# Patient Record
Sex: Female | Born: 1970 | Race: White | Hispanic: No | Marital: Married | Smoking: Former smoker
Health system: Southern US, Community
[De-identification: ages and names within clinical notes are randomized; demographics above are authoritative.]

## PROBLEM LIST (undated history)

## (undated) HISTORY — PX: WISDOM TOOTH EXTRACTION: SHX21

---

## 1998-11-09 ENCOUNTER — Other Ambulatory Visit: Admission: RE | Admit: 1998-11-09 | Discharge: 1998-11-09 | Payer: Self-pay | Admitting: Obstetrics & Gynecology

## 2000-02-08 ENCOUNTER — Inpatient Hospital Stay (HOSPITAL_COMMUNITY): Admission: AD | Admit: 2000-02-08 | Discharge: 2000-02-11 | Payer: Self-pay | Admitting: Obstetrics & Gynecology

## 2002-05-25 ENCOUNTER — Other Ambulatory Visit: Admission: RE | Admit: 2002-05-25 | Discharge: 2002-05-25 | Payer: Self-pay | Admitting: Obstetrics and Gynecology

## 2003-05-28 ENCOUNTER — Other Ambulatory Visit: Admission: RE | Admit: 2003-05-28 | Discharge: 2003-05-28 | Payer: Self-pay | Admitting: Obstetrics and Gynecology

## 2004-07-27 ENCOUNTER — Other Ambulatory Visit: Admission: RE | Admit: 2004-07-27 | Discharge: 2004-07-27 | Payer: Self-pay | Admitting: Obstetrics and Gynecology

## 2005-08-29 ENCOUNTER — Other Ambulatory Visit: Admission: RE | Admit: 2005-08-29 | Discharge: 2005-08-29 | Payer: Self-pay | Admitting: Obstetrics and Gynecology

## 2012-08-27 LAB — OB RESULTS CONSOLE GC/CHLAMYDIA
Chlamydia: NEGATIVE
Gonorrhea: NEGATIVE

## 2012-08-27 LAB — OB RESULTS CONSOLE RPR: RPR: NONREACTIVE

## 2012-08-27 LAB — OB RESULTS CONSOLE HIV ANTIBODY (ROUTINE TESTING): HIV: NONREACTIVE

## 2012-08-27 LAB — OB RESULTS CONSOLE ABO/RH

## 2013-03-20 ENCOUNTER — Encounter (HOSPITAL_COMMUNITY): Payer: Self-pay

## 2013-03-20 ENCOUNTER — Encounter (HOSPITAL_COMMUNITY): Payer: Self-pay | Admitting: Pharmacist

## 2013-03-20 ENCOUNTER — Encounter (HOSPITAL_COMMUNITY)
Admission: RE | Admit: 2013-03-20 | Discharge: 2013-03-20 | Disposition: A | Discharge status: Home / Self Care | Payer: Managed Care, Other (non HMO) | Source: Ambulatory Visit | Attending: Obstetrics & Gynecology | Admitting: Obstetrics & Gynecology

## 2013-03-20 DIAGNOSIS — Z01812 Encounter for preprocedural laboratory examination: Secondary | ICD-10-CM | POA: Insufficient documentation

## 2013-03-20 DIAGNOSIS — Z01818 Encounter for other preprocedural examination: Secondary | ICD-10-CM | POA: Insufficient documentation

## 2013-03-20 LAB — CBC
HCT: 32.4 % — ABNORMAL LOW (ref 36.0–46.0)
Hemoglobin: 11 g/dL — ABNORMAL LOW (ref 12.0–15.0)
MCH: 28.9 pg (ref 26.0–34.0)
MCHC: 34 g/dL (ref 30.0–36.0)
MCV: 85 fL (ref 78.0–100.0)
RBC: 3.81 MIL/uL — ABNORMAL LOW (ref 3.87–5.11)

## 2013-03-20 LAB — ABO/RH: ABO/RH(D): AB POS

## 2013-03-20 LAB — TYPE AND SCREEN: Antibody Screen: NEGATIVE

## 2013-03-20 NOTE — Patient Instructions (Addendum)
Your procedure is scheduled on: 03/23/2013  Enter through the Main Entrance of [facility] at: 0930AM  Pick up the phone at the desk and dial [phone].  Call this number if you have problems the morning of surgery: [phone].  Remember: Do NOT eat food: AFTER MIDNIGHT 03/22/2013 Do NOT drink clear liquids after:  AFTER MIDNIGHT 03/22/2013  Take these medicines the morning of surgery with a SIP OF WATER: NONE  Do NOT wear jewelry (body piercing), make-up, or nail polish. Do NOT wear lotions, powders, or perfumes.  You may wear deoderant. Do NOT shave for 48 hours prior to surgery. Do NOT bring valuables to the hospital. Contacts, dentures, or bridgework may not be worn into surgery. Leave suitcase in car.  After surgery it may be brought to your room.  For patients admitted to the hospital, checkout time is 11:00 AM the day of discharge. Have a responsible adult drive you home and stay with you for 24 hours after your procedure

## 2013-03-20 NOTE — H&P (Signed)
42 y.o. [id number]  Estimated Date of Delivery: 03/24/13 admitted at 39/[redacted] weeks gestation for repeat C/S.  Prenatal Transfer Tool  Maternal Diabetes: No Genetic Screening: Normal Maternal Ultrasounds/Referrals: Normal Fetal Ultrasounds or other Referrals:  None Maternal Substance Abuse:  No Significant Maternal Medications:  None Significant Maternal Lab Results: None Other Significant Pregnancy Complications:  Previous C/S.    Afebrile, VSS Heart and Lungs: No active disease Abdomen: soft, gravid, EFW AGA. Cervical exam:  1/20, vertex floating  Impression: 40 week pregnancy, previous C/S, Unfavorable cervix  Plan:  Repeat C/S

## 2013-03-23 ENCOUNTER — Encounter (HOSPITAL_COMMUNITY)
Admission: AD | Disposition: A | Discharge status: Home / Self Care | Payer: Self-pay | Source: Ambulatory Visit | Attending: Obstetrics & Gynecology

## 2013-03-23 ENCOUNTER — Encounter (HOSPITAL_COMMUNITY): Payer: Self-pay | Admitting: *Deleted

## 2013-03-23 ENCOUNTER — Inpatient Hospital Stay (HOSPITAL_COMMUNITY): Payer: Managed Care, Other (non HMO) | Admitting: Anesthesiology

## 2013-03-23 ENCOUNTER — Encounter (HOSPITAL_COMMUNITY): Payer: Managed Care, Other (non HMO) | Admitting: Anesthesiology

## 2013-03-23 ENCOUNTER — Inpatient Hospital Stay (HOSPITAL_COMMUNITY)
Admission: AD | Admit: 2013-03-23 | Discharge: 2013-03-25 | DRG: 766 | Disposition: A | Discharge status: Home / Self Care | Payer: Managed Care, Other (non HMO) | Source: Ambulatory Visit | Attending: Obstetrics & Gynecology | Admitting: Obstetrics & Gynecology

## 2013-03-23 DIAGNOSIS — O09529 Supervision of elderly multigravida, unspecified trimester: Secondary | ICD-10-CM | POA: Diagnosis present

## 2013-03-23 DIAGNOSIS — O34219 Maternal care for unspecified type scar from previous cesarean delivery: Principal | ICD-10-CM | POA: Diagnosis present

## 2013-03-23 SURGERY — Surgical Case
Anesthesia: Spinal

## 2013-03-23 MED ORDER — DIPHENHYDRAMINE HCL 25 MG PO CAPS: 25.0000 mg | ORAL_CAPSULE | Freq: Four times a day (QID) | ORAL | Status: DC | PRN

## 2013-03-23 MED ORDER — WITCH HAZEL-GLYCERIN EX PADS: 1.0000 "application " | MEDICATED_PAD | CUTANEOUS | Status: DC | PRN

## 2013-03-23 MED ORDER — LACTATED RINGERS IV SOLN
Freq: Once | INTRAVENOUS | Status: AC
  Administered 2013-03-23: 10:00:00 via INTRAVENOUS

## 2013-03-23 MED ORDER — METOCLOPRAMIDE HCL 5 MG/ML IJ SOLN: 10.0000 mg | Freq: Three times a day (TID) | INTRAMUSCULAR | Status: DC | PRN

## 2013-03-23 MED ORDER — ONDANSETRON HCL 4 MG/2ML IJ SOLN
INTRAMUSCULAR | Status: DC | PRN
  Administered 2013-03-23: 4 mg via INTRAVENOUS

## 2013-03-23 MED ORDER — MORPHINE SULFATE 0.5 MG/ML IJ SOLN
INTRAMUSCULAR | Status: AC
  Filled 2013-03-23: qty 10

## 2013-03-23 MED ORDER — TETANUS-DIPHTH-ACELL PERTUSSIS 5-2.5-18.5 LF-MCG/0.5 IM SUSP
0.5000 mL | Freq: Once | INTRAMUSCULAR | Status: AC
  Administered 2013-03-24: 0.5 mL via INTRAMUSCULAR
  Filled 2013-03-23: qty 0.5

## 2013-03-23 MED ORDER — PHENYLEPHRINE HCL 10 MG/ML IJ SOLN
INTRAMUSCULAR | Status: AC
  Filled 2013-03-23: qty 1

## 2013-03-23 MED ORDER — DIPHENHYDRAMINE HCL 25 MG PO CAPS
25.0000 mg | ORAL_CAPSULE | ORAL | Status: DC | PRN
  Filled 2013-03-23: qty 1

## 2013-03-23 MED ORDER — SIMETHICONE 80 MG PO CHEW
80.0000 mg | CHEWABLE_TABLET | Freq: Three times a day (TID) | ORAL | Status: DC
  Administered 2013-03-24 – 2013-03-25 (×4): 80 mg via ORAL
  Filled 2013-03-23 (×4): qty 1

## 2013-03-23 MED ORDER — SCOPOLAMINE 1 MG/3DAYS TD PT72
MEDICATED_PATCH | TRANSDERMAL | Status: AC
  Administered 2013-03-23: 1.5 mg via TRANSDERMAL
  Filled 2013-03-23: qty 1

## 2013-03-23 MED ORDER — MEPERIDINE HCL 25 MG/ML IJ SOLN: 6.2500 mg | INTRAMUSCULAR | Status: DC | PRN

## 2013-03-23 MED ORDER — MENTHOL 3 MG MT LOZG: 1.0000 | LOZENGE | OROMUCOSAL | Status: DC | PRN

## 2013-03-23 MED ORDER — CEFAZOLIN SODIUM-DEXTROSE 2-3 GM-% IV SOLR
INTRAVENOUS | Status: AC
  Administered 2013-03-23: 2 g via INTRAVENOUS
  Filled 2013-03-23: qty 50

## 2013-03-23 MED ORDER — KETOROLAC TROMETHAMINE 30 MG/ML IJ SOLN: 30.0000 mg | Freq: Four times a day (QID) | INTRAMUSCULAR | Status: AC | PRN

## 2013-03-23 MED ORDER — PHENYLEPHRINE 8 MG IN D5W 100 ML (0.08MG/ML) PREMIX OPTIME
INJECTION | INTRAVENOUS | Status: DC | PRN
  Administered 2013-03-23: 40 ug/min via INTRAVENOUS

## 2013-03-23 MED ORDER — ONDANSETRON HCL 4 MG/2ML IJ SOLN: 4.0000 mg | Freq: Three times a day (TID) | INTRAMUSCULAR | Status: DC | PRN

## 2013-03-23 MED ORDER — ONDANSETRON HCL 4 MG/2ML IJ SOLN
INTRAMUSCULAR | Status: AC
  Filled 2013-03-23: qty 2

## 2013-03-23 MED ORDER — ONDANSETRON HCL 4 MG/2ML IJ SOLN: 4.0000 mg | INTRAMUSCULAR | Status: DC | PRN

## 2013-03-23 MED ORDER — KETOROLAC TROMETHAMINE 30 MG/ML IJ SOLN
INTRAMUSCULAR | Status: AC
  Filled 2013-03-23: qty 1

## 2013-03-23 MED ORDER — OXYTOCIN 40 UNITS IN LACTATED RINGERS INFUSION - SIMPLE MED
INTRAVENOUS | Status: DC | PRN
  Administered 2013-03-23: 40 [IU] via INTRAVENOUS

## 2013-03-23 MED ORDER — FENTANYL CITRATE 0.05 MG/ML IJ SOLN: 25.0000 ug | INTRAMUSCULAR | Status: DC | PRN

## 2013-03-23 MED ORDER — DIPHENHYDRAMINE HCL 50 MG/ML IJ SOLN: 12.5000 mg | INTRAMUSCULAR | Status: DC | PRN

## 2013-03-23 MED ORDER — DIBUCAINE 1 % RE OINT: 1.0000 "application " | TOPICAL_OINTMENT | RECTAL | Status: DC | PRN

## 2013-03-23 MED ORDER — OXYTOCIN 10 UNIT/ML IJ SOLN
INTRAMUSCULAR | Status: AC
  Filled 2013-03-23: qty 4

## 2013-03-23 MED ORDER — DIPHENHYDRAMINE HCL 50 MG/ML IJ SOLN: 25.0000 mg | INTRAMUSCULAR | Status: DC | PRN

## 2013-03-23 MED ORDER — OXYCODONE-ACETAMINOPHEN 5-325 MG PO TABS: 1.0000 | ORAL_TABLET | ORAL | Status: DC | PRN

## 2013-03-23 MED ORDER — LANOLIN HYDROUS EX OINT: 1.0000 "application " | TOPICAL_OINTMENT | CUTANEOUS | Status: DC | PRN

## 2013-03-23 MED ORDER — NALBUPHINE HCL 10 MG/ML IJ SOLN
5.0000 mg | INTRAMUSCULAR | Status: DC | PRN
  Filled 2013-03-23: qty 1

## 2013-03-23 MED ORDER — SIMETHICONE 80 MG PO CHEW
80.0000 mg | CHEWABLE_TABLET | ORAL | Status: DC
  Administered 2013-03-23 – 2013-03-24 (×2): 80 mg via ORAL
  Filled 2013-03-23 (×2): qty 1

## 2013-03-23 MED ORDER — FENTANYL CITRATE 0.05 MG/ML IJ SOLN
INTRAMUSCULAR | Status: AC
  Filled 2013-03-23: qty 2

## 2013-03-23 MED ORDER — SENNOSIDES-DOCUSATE SODIUM 8.6-50 MG PO TABS
2.0000 | ORAL_TABLET | ORAL | Status: DC
  Administered 2013-03-23 – 2013-03-24 (×2): 2 via ORAL
  Filled 2013-03-23 (×2): qty 2

## 2013-03-23 MED ORDER — ZOLPIDEM TARTRATE 5 MG PO TABS: 5.0000 mg | ORAL_TABLET | Freq: Every evening | ORAL | Status: DC | PRN

## 2013-03-23 MED ORDER — SIMETHICONE 80 MG PO CHEW: 80.0000 mg | CHEWABLE_TABLET | ORAL | Status: DC | PRN

## 2013-03-23 MED ORDER — PRENATAL MULTIVITAMIN CH
1.0000 | ORAL_TABLET | Freq: Every day | ORAL | Status: DC
  Administered 2013-03-24 – 2013-03-25 (×2): 1 via ORAL
  Filled 2013-03-23 (×2): qty 1

## 2013-03-23 MED ORDER — SODIUM CHLORIDE 0.9 % IJ SOLN: 3.0000 mL | INTRAMUSCULAR | Status: DC | PRN

## 2013-03-23 MED ORDER — BUPIVACAINE IN DEXTROSE 0.75-8.25 % IT SOLN
INTRATHECAL | Status: DC | PRN
  Administered 2013-03-23: 1.4 mL via INTRATHECAL

## 2013-03-23 MED ORDER — NALOXONE HCL 0.4 MG/ML IJ SOLN: 0.4000 mg | INTRAMUSCULAR | Status: DC | PRN

## 2013-03-23 MED ORDER — ONDANSETRON HCL 4 MG PO TABS: 4.0000 mg | ORAL_TABLET | ORAL | Status: DC | PRN

## 2013-03-23 MED ORDER — IBUPROFEN 600 MG PO TABS
600.0000 mg | ORAL_TABLET | Freq: Four times a day (QID) | ORAL | Status: DC
  Administered 2013-03-23 – 2013-03-25 (×8): 600 mg via ORAL
  Filled 2013-03-23 (×8): qty 1

## 2013-03-23 MED ORDER — OXYTOCIN 40 UNITS IN LACTATED RINGERS INFUSION - SIMPLE MED: 62.5000 mL/h | INTRAVENOUS | Status: AC

## 2013-03-23 MED ORDER — SCOPOLAMINE 1 MG/3DAYS TD PT72
1.0000 | MEDICATED_PATCH | Freq: Once | TRANSDERMAL | Status: DC
  Administered 2013-03-23: 1.5 mg via TRANSDERMAL

## 2013-03-23 MED ORDER — LACTATED RINGERS IV SOLN
INTRAVENOUS | Status: DC
  Administered 2013-03-23 (×2): via INTRAVENOUS

## 2013-03-23 MED ORDER — PHENYLEPHRINE 40 MCG/ML (10ML) SYRINGE FOR IV PUSH (FOR BLOOD PRESSURE SUPPORT)
PREFILLED_SYRINGE | INTRAVENOUS | Status: AC
  Filled 2013-03-23: qty 5

## 2013-03-23 MED ORDER — NALOXONE HCL 1 MG/ML IJ SOLN
1.0000 ug/kg/h | INTRAVENOUS | Status: DC | PRN
  Filled 2013-03-23: qty 2

## 2013-03-23 MED ORDER — FENTANYL CITRATE 0.05 MG/ML IJ SOLN
INTRAMUSCULAR | Status: DC | PRN
  Administered 2013-03-23: 25 ug via INTRATHECAL

## 2013-03-23 MED ORDER — LACTATED RINGERS IV SOLN
INTRAVENOUS | Status: DC
  Administered 2013-03-23: 21:00:00 via INTRAVENOUS

## 2013-03-23 MED ORDER — CEFAZOLIN SODIUM-DEXTROSE 2-3 GM-% IV SOLR: 2.0000 g | INTRAVENOUS | Status: DC

## 2013-03-23 MED ORDER — LACTATED RINGERS IV SOLN
INTRAVENOUS | Status: DC | PRN
  Administered 2013-03-23: 11:00:00 via INTRAVENOUS

## 2013-03-23 MED ORDER — KETOROLAC TROMETHAMINE 30 MG/ML IJ SOLN
30.0000 mg | Freq: Four times a day (QID) | INTRAMUSCULAR | Status: AC | PRN
  Administered 2013-03-23: 30 mg via INTRAMUSCULAR

## 2013-03-23 MED ORDER — MORPHINE SULFATE (PF) 0.5 MG/ML IJ SOLN
INTRAMUSCULAR | Status: DC | PRN
  Administered 2013-03-23: .15 mg via INTRATHECAL

## 2013-03-23 SURGICAL SUPPLY — 37 items
CLAMP CORD UMBIL (MISCELLANEOUS) IMPLANT
CLOTH BEACON ORANGE TIMEOUT ST (SAFETY) ×2 IMPLANT
CONTAINER PREFILL 10% NBF 15ML (MISCELLANEOUS) IMPLANT
DRAPE LG THREE QUARTER DISP (DRAPES) ×4 IMPLANT
DRSG OPSITE POSTOP 4X10 (GAUZE/BANDAGES/DRESSINGS) ×2 IMPLANT
DURAPREP 26ML APPLICATOR (WOUND CARE) ×2 IMPLANT
ELECT REM PT RETURN 9FT ADLT (ELECTROSURGICAL) ×2
ELECTRODE REM PT RTRN 9FT ADLT (ELECTROSURGICAL) ×1 IMPLANT
EXTRACTOR VACUUM BELL STYLE (SUCTIONS) ×1 IMPLANT
EXTRACTOR VACUUM M CUP 4 TUBE (SUCTIONS) IMPLANT
GLOVE ECLIPSE 6.0 STRL STRAW (GLOVE) ×2 IMPLANT
GLOVE ECLIPSE 6.5 STRL STRAW (GLOVE) ×2 IMPLANT
GOWN PREVENTION PLUS XLARGE (GOWN DISPOSABLE) ×4 IMPLANT
GOWN STRL REIN XL XLG (GOWN DISPOSABLE) ×4 IMPLANT
KIT ABG SYR 3ML LUER SLIP (SYRINGE) IMPLANT
NDL HYPO 25X5/8 SAFETYGLIDE (NEEDLE) ×1 IMPLANT
NEEDLE HYPO 25X5/8 SAFETYGLIDE (NEEDLE) ×2 IMPLANT
NS IRRIG 1000ML POUR BTL (IV SOLUTION) ×2 IMPLANT
PACK C SECTION WH (CUSTOM PROCEDURE TRAY) ×2 IMPLANT
PAD OB MATERNITY 4.3X12.25 (PERSONAL CARE ITEMS) ×2 IMPLANT
RTRCTR C-SECT PINK 25CM LRG (MISCELLANEOUS) ×1 IMPLANT
STAPLER VISISTAT 35W (STAPLE) ×1 IMPLANT
SUT PLAIN 0 NONE (SUTURE) IMPLANT
SUT VIC AB 0 CT1 27 (SUTURE) ×6
SUT VIC AB 0 CT1 27XBRD ANBCTR (SUTURE) ×3 IMPLANT
SUT VIC AB 1 CTX 36 (SUTURE) ×4
SUT VIC AB 1 CTX36XBRD ANBCTRL (SUTURE) ×2 IMPLANT
SUT VIC AB 3-0 CT1 27 (SUTURE) ×2
SUT VIC AB 3-0 CT1 TAPERPNT 27 (SUTURE) ×1 IMPLANT
SUT VIC AB 3-0 PS2 18 (SUTURE)
SUT VIC AB 3-0 PS2 18XBRD (SUTURE) IMPLANT
SUT VIC AB 3-0 SH 27 (SUTURE)
SUT VIC AB 3-0 SH 27X BRD (SUTURE) IMPLANT
TOWEL OR 17X24 6PK STRL BLUE (TOWEL DISPOSABLE) ×2 IMPLANT
TRAY FOLEY BAG SILVER LF 14FR (CATHETERS) ×1 IMPLANT
TRAY FOLEY CATH 14FR (SET/KITS/TRAYS/PACK) ×1 IMPLANT
WATER STERILE IRR 1000ML POUR (IV SOLUTION) ×1 IMPLANT

## 2013-03-23 NOTE — Op Note (Addendum)
Patient Name: Doris Walker MRN: [id number]  Date of Surgery: 03/23/2013    PREOPERATIVE DIAGNOSIS: Previous C/S 59510  POSTOPERATIVE DIAGNOSIS: REPEAT C/S 78295   PROCEDURE: Repeat low transverse cesarean section  SURGEON: Caralyn Guile. Arlyce Dice M.D.  ASSISTANTLuvenia Redden, MD.  ANESTHESIA: Spinal  ESTIMATED BLOOD LOSS: 800 ml  FINDINGS: Female, BW 7 lbs 15 oz, Apgar 9,9; clear fluid.  Normal uterus and adnexa   INDICATIONS: Previous cesarean delivery.  Patient elects repeat cesarean.  PROCEDURE IN DETAIL: The patient was taken to the operating room and spinal anesthesia was placed.  She was then placed in the supine position with left lateral displacement of the uterus. The abdomen was prepped and draped in a sterile fashion and the bladder was catheterized.  A low transverse abdominal incision was made and carried down to the fascia. The fascia was opened transversely and the rectus sheath was dissected from the underlying rectus muscle. The rectus midline was identified and opened by sharp and blunt dissection. The peritoneum was opened. An Alexis retractor was placed and the lower uterine segment was identified, entered transversely by careful sharp dissection, and extended bluntly.  The infant was delivered with the aid of the vacuum extractor. The placenta was sent for cord blood collection. The uterus was bluntly curettage. The lower segment was closed with running interlocking Vicryl 1 suture.  The peritoneum and rectus muscle were closed in the midline with running 3-0 Vicryl suture. The fascia was closed with running 0 Vicryl suture and the skin was closed with staples. All sponge and instrument counts were correct.  The patient tolerated the procedure well and left the operating room in good condition.

## 2013-03-23 NOTE — Anesthesia Postprocedure Evaluation (Signed)
Anesthesia Post Note  Patient: Doris Walker  Procedure(s) Performed: Procedure(s) (LRB): CESAREAN SECTION (N/A)  Anesthesia type: Spinal  Patient location: PACU  Post pain: Pain level controlled  Post assessment: Post-op Vital signs reviewed  Last Vitals:  Filed Vitals:   03/23/13 1200  BP: 108/62  Pulse: 77  Temp:   Resp: 20    Post vital signs: Reviewed  Level of consciousness: awake  Complications: No apparent anesthesia complications

## 2013-03-23 NOTE — Transfer of Care (Signed)
Immediate Anesthesia Transfer of Care Note  Patient: Doris Walker  Procedure(s) Performed: Procedure(s): CESAREAN SECTION (N/A)  Patient Location: PACU  Anesthesia Type:Spinal  Level of Consciousness: awake  Airway & Oxygen Therapy: Patient Spontanous Breathing  Post-op Assessment: Report given to PACU RN and Post -op Vital signs reviewed and stable  Post vital signs: stable  Complications: No apparent anesthesia complications

## 2013-03-23 NOTE — Progress Notes (Signed)
I have interviewed and performed the pertinent exams on my patient to confirm that there have been no significant changes in her condition since the dictation of her history and physical exam.  

## 2013-03-23 NOTE — Lactation Note (Signed)
This note was copied from the chart of Doris Walker. Lactation Consultation Note  Patient Name: Doris Walker [id number] Date: 03/23/2013 Reason for consult: Initial assessment Baby STS when I arrived for this visit. Starting to give some feeding ques. Assisted Mom with latching baby to right breast in football hold. Mom's nipples are flat but compressible. Demonstrated hand expression and reverse pressure massage to Mom. Mom pre-pumped with hand pump. Baby latched with assist and demonstrated a good suckling pattern with few swallows noted. Encouraged to BF with feeding ques. Lactation brochure left for review. Advised of OP services and support group. Advised to call for assist as needed.   Maternal Data Formula Feeding for Exclusion: No Infant to breast within first hour of birth: Yes Has patient been taught Hand Expression?: Yes Does the patient have breastfeeding experience prior to this delivery?: Yes  Feeding Feeding Type: Breast Fed Length of feed: 10 min  LATCH Score/Interventions Latch: Repeated attempts needed to sustain latch, nipple held in mouth throughout feeding, stimulation needed to elicit sucking reflex. Intervention(s): Adjust position;Assist with latch;Breast massage;Breast compression  Audible Swallowing: A few with stimulation  Type of Nipple: Flat Intervention(s): Hand pump;Reverse pressure  Comfort (Breast/Nipple): Soft / non-tender     Hold (Positioning): Assistance needed to correctly position infant at breast and maintain latch. Intervention(s): Breastfeeding basics reviewed;Support Pillows;Position options;Skin to skin  LATCH Score: 6  Lactation Tools Discussed/Used Tools: Pump Breast pump type: Manual WIC Program: No   Consult Status Consult Status: Follow-up Date: 03/24/13 Follow-up type: In-patient    Alfred Levins 03/23/2013, 9:40 PM

## 2013-03-23 NOTE — Anesthesia Procedure Notes (Signed)
Spinal  Patient location during procedure: OR Start time: 03/23/2013 11:06 AM Staffing Anesthesiologist: Dee Maday A. Performed by: anesthesiologist  Preanesthetic Checklist Completed: patient identified, site marked, surgical consent, pre-op evaluation, timeout performed, IV checked, risks and benefits discussed and monitors and equipment checked Spinal Block Patient position: sitting Prep: site prepped and draped and DuraPrep Patient monitoring: heart rate, cardiac monitor, continuous pulse ox and blood pressure Approach: midline Location: L3-4 Injection technique: single-shot Needle Needle type: Sprotte  Needle gauge: 24 G Needle length: 9 cm Assessment Sensory level: T4 Additional Notes Patient tolerated procedure well. Adequate sensory level.

## 2013-03-23 NOTE — Anesthesia Preprocedure Evaluation (Signed)
Anesthesia Evaluation  Patient identified by MRN, date of birth, ID band Patient awake    Reviewed: Allergy & Precautions, H&P , Patient's Chart, lab work & pertinent test results  Airway Mallampati: II TM Distance: >3 FB Neck ROM: Full    Dental no notable dental hx. (+) Teeth Intact   Pulmonary neg pulmonary ROS,  breath sounds clear to auscultation  Pulmonary exam normal       Cardiovascular negative cardio ROS  Rhythm:Regular Rate:Normal     Neuro/Psych negative neurological ROS  negative psych ROS   GI/Hepatic negative GI ROS, Neg liver ROS,   Endo/Other  negative endocrine ROS  Renal/GU negative Renal ROS  negative genitourinary   Musculoskeletal negative musculoskeletal ROS (+)   Abdominal (+) - obese,   Peds  Hematology negative hematology ROS (+)   Anesthesia Other Findings   Reproductive/Obstetrics (+) Pregnancy Previous C/Section                           Anesthesia Physical Anesthesia Plan  ASA: II  Anesthesia Plan: Spinal   Post-op Pain Management:    Induction:   Airway Management Planned: Natural Airway  Additional Equipment:   Intra-op Plan:   Post-operative Plan: Extubation in OR  Informed Consent: I have reviewed the patients History and Physical, chart, labs and discussed the procedure including the risks, benefits and alternatives for the proposed anesthesia with the patient or authorized representative who has indicated his/her understanding and acceptance.     Plan Discussed with: CRNA, Anesthesiologist and Surgeon  Anesthesia Plan Comments:         Anesthesia Quick Evaluation

## 2013-03-24 ENCOUNTER — Encounter (HOSPITAL_COMMUNITY): Payer: Self-pay | Admitting: Obstetrics & Gynecology

## 2013-03-24 LAB — CBC
MCV: 85.1 fL (ref 78.0–100.0)
Platelets: 181 10*3/uL (ref 150–400)
RDW: 14.8 % (ref 11.5–15.5)
WBC: 12.7 10*3/uL — ABNORMAL HIGH (ref 4.0–10.5)

## 2013-03-24 NOTE — Progress Notes (Signed)
  Patient is eating, ambulating, voiding.  Pain control is good.  Filed Vitals:   03/23/13 1945 03/23/13 2200 03/24/13 0200 03/24/13 0600  BP: 99/55 99/47 113/53 112/64  Pulse: 70 73 82 80  Temp: 97.2 F (36.2 C) 98.1 F (36.7 C)  97.9 F (36.6 C)  TempSrc: Oral   Oral  Resp: 20 20 18 20   Weight:      SpO2: 98% 96% 96% 95%    lungs:   clear to auscultation cor:    RRR Abdomen:  soft, appropriate tenderness, incisions intact and without erythema or exudate ex:    no cords   Lab Results  Component Value Date   WBC 12.7* 03/24/2013   HGB 10.5* 03/24/2013   HCT 30.8* 03/24/2013   MCV 85.1 03/24/2013   PLT 181 03/24/2013    --/--/AB POS, AB POS (10/31 1120)/RI  A/P    Post operative day 1.  Routine post op and postpartum care.  Expect d/c tomorrow.  Percocet for pain control.    Parents desires circumsision.  All risks, benefits and alternatives discussed with the mother.

## 2013-03-25 MED ORDER — OXYCODONE-ACETAMINOPHEN 5-325 MG PO TABS: 1.0000 | ORAL_TABLET | ORAL | Status: DC | PRN

## 2013-03-25 NOTE — Discharge Summary (Signed)
Obstetric Discharge Summary Reason for Admission: cesarean section Prenatal Procedures: ultrasound Intrapartum Procedures: cesarean: low cervical, transverse Postpartum Procedures: none Complications-Operative and Postpartum: none Hemoglobin  Date Value Range Status  03/24/2013 10.5* 12.0 - 15.0 g/dL Final     HCT  Date Value Range Status  03/24/2013 30.8* 36.0 - 46.0 % Final    Physical Exam:  General: alert and cooperative Lochia: appropriate Uterine Fundus: firm Incision: healing well, no significant drainage, no significant erythema DVT Evaluation: No evidence of DVT seen on physical exam.  Discharge Diagnoses: Term Pregnancy-delivered  Discharge Information: Date: 03/25/2013 Activity: pelvic rest Diet: routine Medications: PNV, Ibuprofen and Percocet Condition: stable Instructions: refer to practice specific booklet Discharge to: home Follow-up Information   Follow up with Mickel Baas, MD In 2 days. (staple removal)    Specialty:  Obstetrics and Gynecology   Contact information:   [address] [place] [address] [phone]       Newborn Data: Live born female  Birth Weight: 7 lb 15.3 oz (3609 g) APGAR: 9, 9  Home with mother.  Philip Aspen 03/25/2013, 9:38 AM

## 2014-03-22 ENCOUNTER — Encounter (HOSPITAL_COMMUNITY): Payer: Self-pay | Admitting: Obstetrics & Gynecology

## 2015-10-05 ENCOUNTER — Ambulatory Visit (INDEPENDENT_AMBULATORY_CARE_PROVIDER_SITE_OTHER): Payer: 59 | Admitting: Podiatry

## 2015-10-05 ENCOUNTER — Ambulatory Visit: Payer: Self-pay

## 2015-10-05 ENCOUNTER — Encounter: Payer: Self-pay | Admitting: Podiatry

## 2015-10-05 ENCOUNTER — Ambulatory Visit (INDEPENDENT_AMBULATORY_CARE_PROVIDER_SITE_OTHER): Payer: 59

## 2015-10-05 VITALS — BP 120/87 | HR 90 | Resp 16 | Ht 62.0 in | Wt 135.0 lb

## 2015-10-05 DIAGNOSIS — M79671 Pain in right foot: Secondary | ICD-10-CM | POA: Diagnosis not present

## 2015-10-05 DIAGNOSIS — M79672 Pain in left foot: Secondary | ICD-10-CM

## 2015-10-05 DIAGNOSIS — M722 Plantar fascial fibromatosis: Secondary | ICD-10-CM

## 2015-10-05 MED ORDER — TRIAMCINOLONE ACETONIDE 10 MG/ML IJ SUSP
10.0000 mg | Freq: Once | INTRAMUSCULAR | Status: AC
Start: 2015-10-05 — End: 2015-10-05
  Administered 2015-10-05: 10 mg

## 2015-10-05 MED ORDER — DICLOFENAC SODIUM 75 MG PO TBEC: 75.0000 mg | DELAYED_RELEASE_TABLET | Freq: Two times a day (BID) | ORAL | Status: AC

## 2015-10-05 NOTE — Progress Notes (Signed)
Subjective:     Patient ID: Doris Walker, female   DOB: 01-Mar-1971, 45 y.o.   MRN: [id number]  HPI patient states that she has a lot of pain in the heel left over right and that it's making it difficult to walk. Patient states it's been going on for at least a year and getting gradually worse and it's affecting her activities and she likes to be active   Review of Systems  All other systems reviewed and are negative.      Objective:   Physical Exam  Constitutional: She is oriented to person, place, and time.  Cardiovascular: Intact distal pulses.   Musculoskeletal: Normal range of motion.  Neurological: She is oriented to person, place, and time.  Skin: Skin is warm.  Nursing note and vitals reviewed.  neurovascular status intact muscle strength adequate range of motion within normal limits with patient found to have exquisite discomfort plantar aspect left heel at the insertional point tendon into the calcaneus with inflammation and fluid buildup noted. Patient's found to have good digital perfusion is well oriented 3 with moderate depression of the arch noted upon gait     Assessment:     Acute plantar fasciitis left over right with inflammation and fluid around the medial band    Plan:     H&P and x-rays of both feet reviewed. Today I injected the left plantar fascia 3 mg Kenalog 5 mg Xylocaine and applied fascial brace and gave instructions on physical therapy. Placed on diclofenac 75 mg twice a day and instructed on not going barefoot and reappoint and discussed long-term orthotic treatment  X-ray report indicates large plantar spur formation bilateral heel

## 2015-10-05 NOTE — Patient Instructions (Signed)

## 2015-10-05 NOTE — Progress Notes (Signed)
   Subjective:    Patient ID: Doris Walker, female    DOB: 09-28-1970, 45 y.o.   MRN: [id number]  HPI Chief Complaint  Patient presents with  . Foot Pain    Bilateral; heel; pt stated, "Hurts more in the morning; Left foot hurts worse"; x1 yr      Review of Systems  All other systems reviewed and are negative.      Objective:   Physical Exam        Assessment & Plan:

## 2015-10-10 ENCOUNTER — Ambulatory Visit: Payer: Managed Care, Other (non HMO) | Admitting: Podiatry

## 2015-10-19 ENCOUNTER — Encounter: Payer: Self-pay | Admitting: Podiatry

## 2015-10-19 ENCOUNTER — Ambulatory Visit (INDEPENDENT_AMBULATORY_CARE_PROVIDER_SITE_OTHER): Payer: 59 | Admitting: Podiatry

## 2015-10-19 DIAGNOSIS — M722 Plantar fascial fibromatosis: Secondary | ICD-10-CM | POA: Diagnosis not present

## 2015-10-19 NOTE — Progress Notes (Signed)
Subjective:     Patient ID: Doris Walker, female   DOB: [birth year], 45 y.o.   MRN: [id number]  HPI patient states that her heel is improved but she has had a one year history of this and still gets pain if she tries to be active   Review of Systems     Objective:   Physical Exam Neurovascular status intact with diminishment of discomfort plantar aspect left heel insertional point of the tendon into the calcaneus    Assessment:     Plantar fasciitis improved left    Plan:     Advised on physical therapy anti-inflammatories and scanned for custom orthotics to reduce plantar pressures on the heel

## 2015-11-09 ENCOUNTER — Ambulatory Visit: Payer: 59 | Admitting: *Deleted

## 2015-11-09 DIAGNOSIS — M722 Plantar fascial fibromatosis: Secondary | ICD-10-CM

## 2015-11-09 NOTE — Patient Instructions (Signed)

## 2015-11-09 NOTE — Progress Notes (Signed)
Patient ID: Doris Walker, female   DOB: 1970-12-29, 45 y.o.   MRN: [id number] Patient presents for orthotic pick up.  Verbal and written break in and wear instructions given.  Patient will follow up in 4 weeks if symptoms worsen or fail to improve.

## 2016-06-04 DIAGNOSIS — J069 Acute upper respiratory infection, unspecified: Secondary | ICD-10-CM | POA: Diagnosis not present

## 2016-06-04 DIAGNOSIS — R05 Cough: Secondary | ICD-10-CM | POA: Diagnosis not present

## 2016-06-04 DIAGNOSIS — R8299 Other abnormal findings in urine: Secondary | ICD-10-CM | POA: Diagnosis not present

## 2017-01-09 DIAGNOSIS — Z01419 Encounter for gynecological examination (general) (routine) without abnormal findings: Secondary | ICD-10-CM | POA: Diagnosis not present

## 2017-01-09 DIAGNOSIS — Z1231 Encounter for screening mammogram for malignant neoplasm of breast: Secondary | ICD-10-CM | POA: Diagnosis not present

## 2017-04-24 DIAGNOSIS — Z13220 Encounter for screening for lipoid disorders: Secondary | ICD-10-CM | POA: Diagnosis not present

## 2017-04-24 DIAGNOSIS — Z0000 Encounter for general adult medical examination without abnormal findings: Secondary | ICD-10-CM | POA: Diagnosis not present

## 2017-08-03 DIAGNOSIS — R05 Cough: Secondary | ICD-10-CM | POA: Diagnosis not present

## 2017-08-03 DIAGNOSIS — J069 Acute upper respiratory infection, unspecified: Secondary | ICD-10-CM | POA: Diagnosis not present

## 2017-12-06 DIAGNOSIS — R350 Frequency of micturition: Secondary | ICD-10-CM | POA: Diagnosis not present

## 2018-01-21 DIAGNOSIS — M545 Low back pain: Secondary | ICD-10-CM | POA: Diagnosis not present

## 2018-02-06 DIAGNOSIS — Z1231 Encounter for screening mammogram for malignant neoplasm of breast: Secondary | ICD-10-CM | POA: Diagnosis not present

## 2018-02-06 DIAGNOSIS — Z01419 Encounter for gynecological examination (general) (routine) without abnormal findings: Secondary | ICD-10-CM | POA: Diagnosis not present

## 2018-02-07 ENCOUNTER — Other Ambulatory Visit: Payer: Self-pay | Admitting: Obstetrics & Gynecology

## 2018-02-07 DIAGNOSIS — R928 Other abnormal and inconclusive findings on diagnostic imaging of breast: Secondary | ICD-10-CM

## 2018-02-11 DIAGNOSIS — M545 Low back pain: Secondary | ICD-10-CM | POA: Diagnosis not present

## 2018-02-12 ENCOUNTER — Ambulatory Visit
Admission: RE | Admit: 2018-02-12 | Discharge: 2018-02-12 | Disposition: A | Discharge status: Home / Self Care | Payer: 59 | Source: Ambulatory Visit | Attending: Obstetrics & Gynecology | Admitting: Obstetrics & Gynecology

## 2018-02-12 ENCOUNTER — Ambulatory Visit
Admission: RE | Admit: 2018-02-12 | Discharge: 2018-02-12 | Disposition: A | Discharge status: Home / Self Care | Payer: Managed Care, Other (non HMO) | Source: Ambulatory Visit | Attending: Obstetrics & Gynecology | Admitting: Obstetrics & Gynecology

## 2018-02-12 DIAGNOSIS — N6310 Unspecified lump in the right breast, unspecified quadrant: Secondary | ICD-10-CM | POA: Diagnosis not present

## 2018-02-12 DIAGNOSIS — R928 Other abnormal and inconclusive findings on diagnostic imaging of breast: Secondary | ICD-10-CM | POA: Diagnosis not present

## 2018-02-14 DIAGNOSIS — M545 Low back pain: Secondary | ICD-10-CM | POA: Diagnosis not present

## 2018-02-18 DIAGNOSIS — M545 Low back pain: Secondary | ICD-10-CM | POA: Diagnosis not present

## 2018-05-05 DIAGNOSIS — Z0000 Encounter for general adult medical examination without abnormal findings: Secondary | ICD-10-CM | POA: Diagnosis not present

## 2019-07-13 IMAGING — MG DIGITAL DIAGNOSTIC UNILATERAL RIGHT MAMMOGRAM WITH TOMO AND CAD
6 series · 6 of 18 positions shown · non-contrast
Comparison: Previous exam(s).

CLINICAL DATA: Screening recall for a possible right breast mass.

EXAM:
DIGITAL DIAGNOSTIC RIGHT MAMMOGRAM WITH CAD AND TOMO
ULTRASOUND RIGHT BREAST

[R CC synth-2D (1 of 2)]
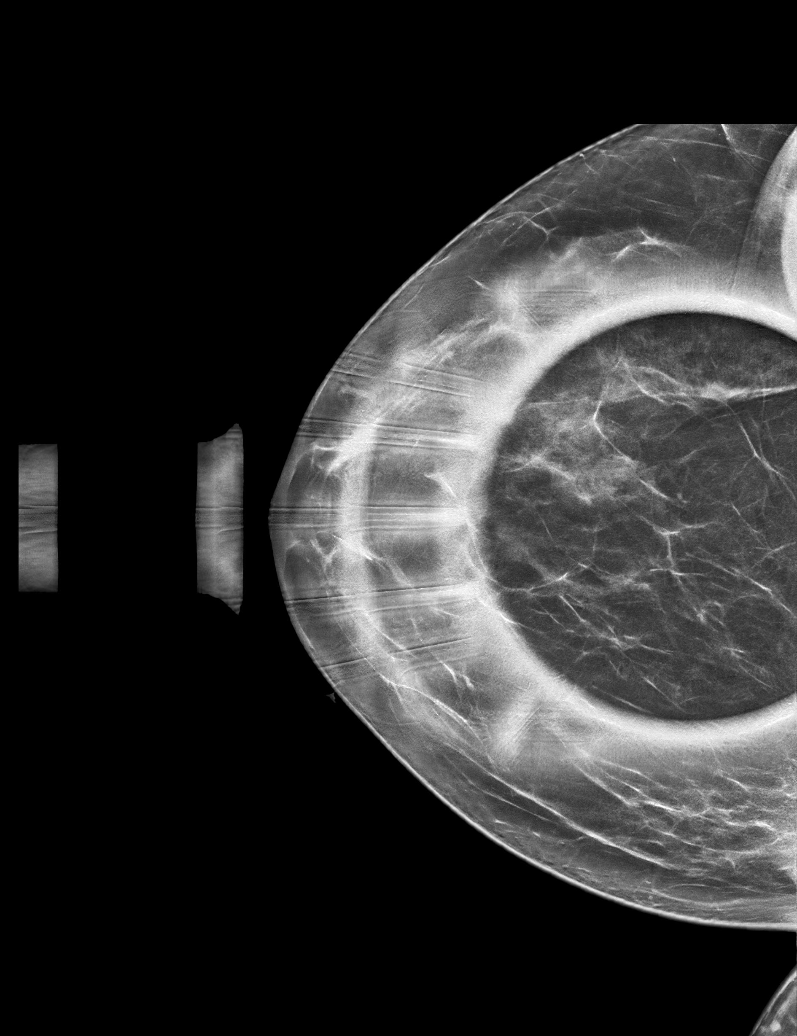

[R MLO synth-2D]
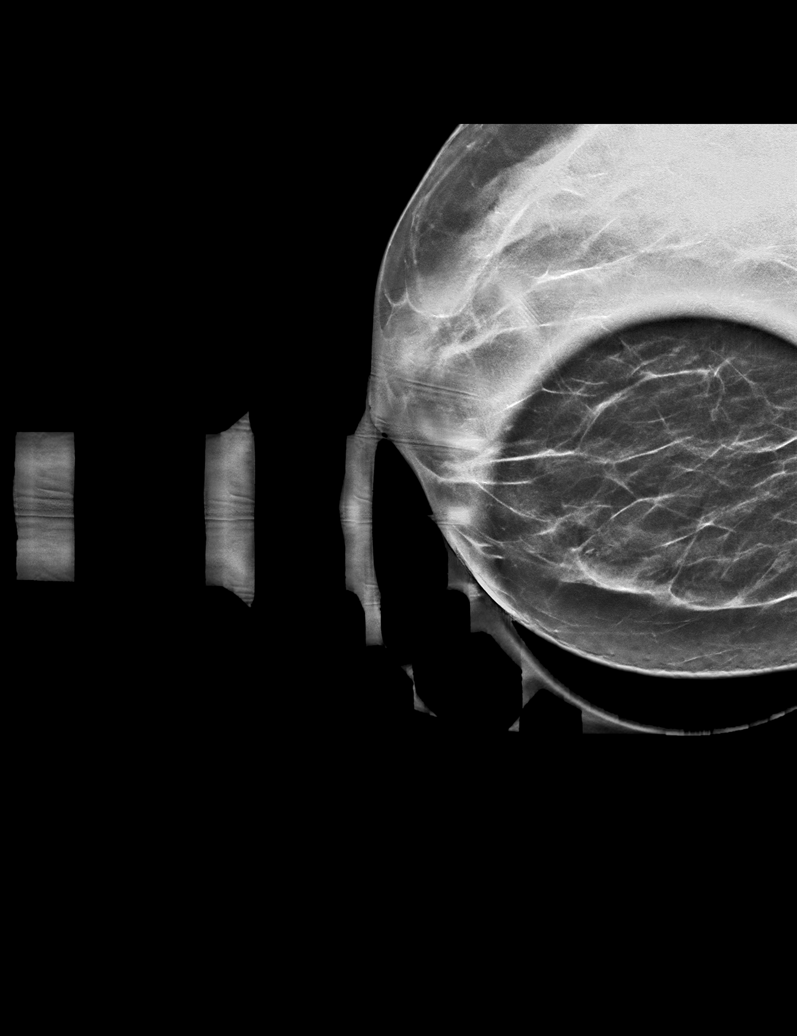

[R CC synth-2D (2 of 2)]
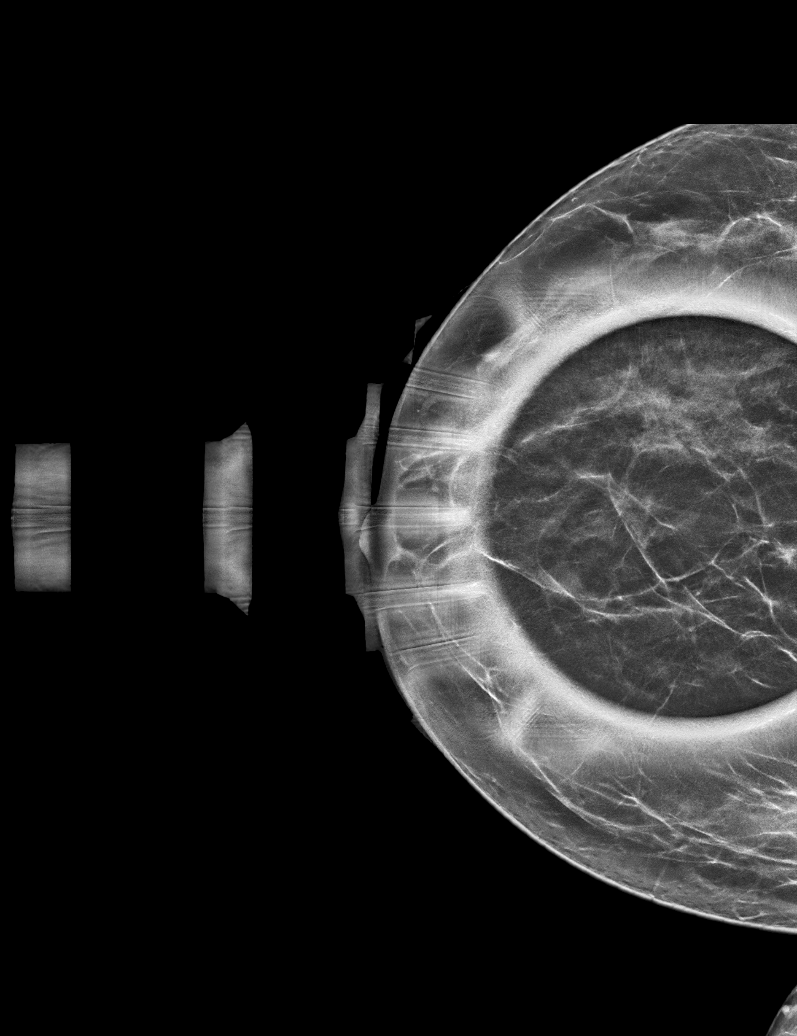

[R CC tomo (1 of 2) · tomo slice 31/61.0]
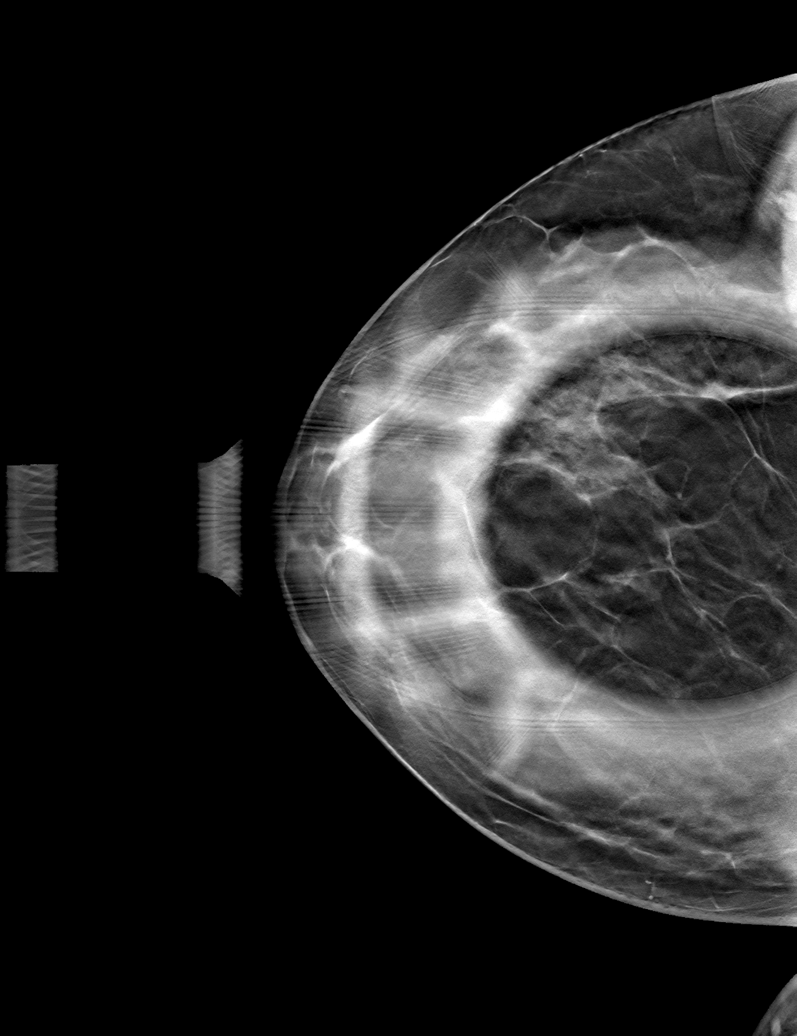

[R MLO tomo · tomo slice 25/50.0]
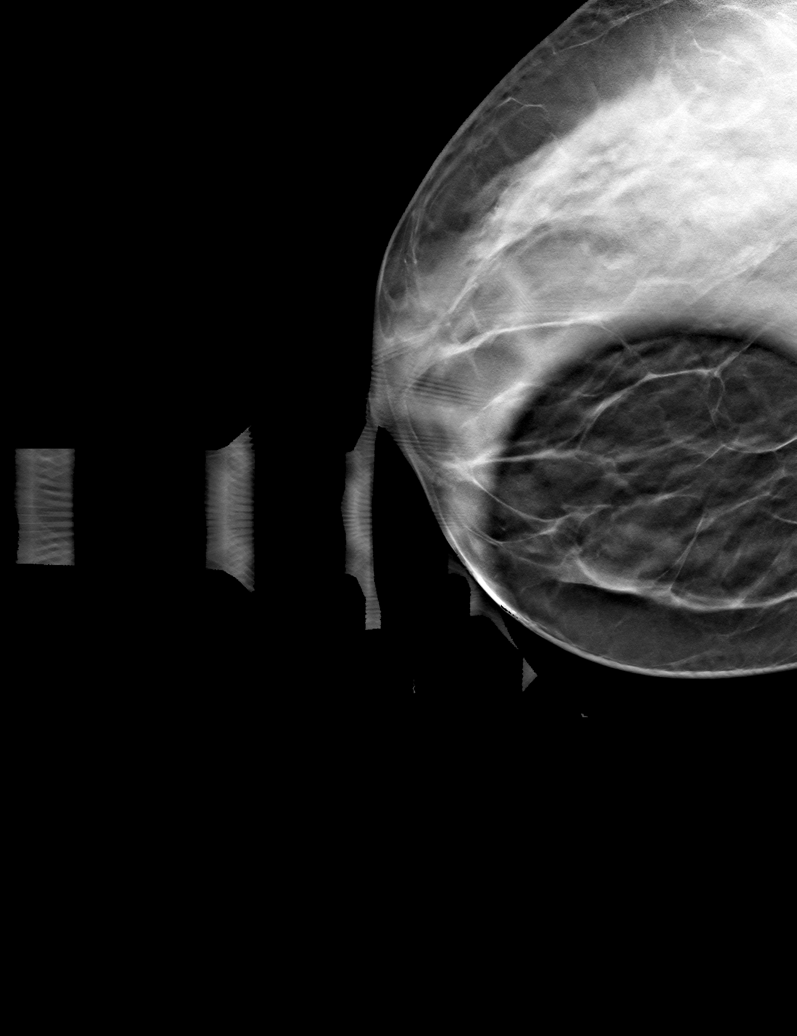

[R CC tomo (2 of 2) · tomo slice 27/52.0]
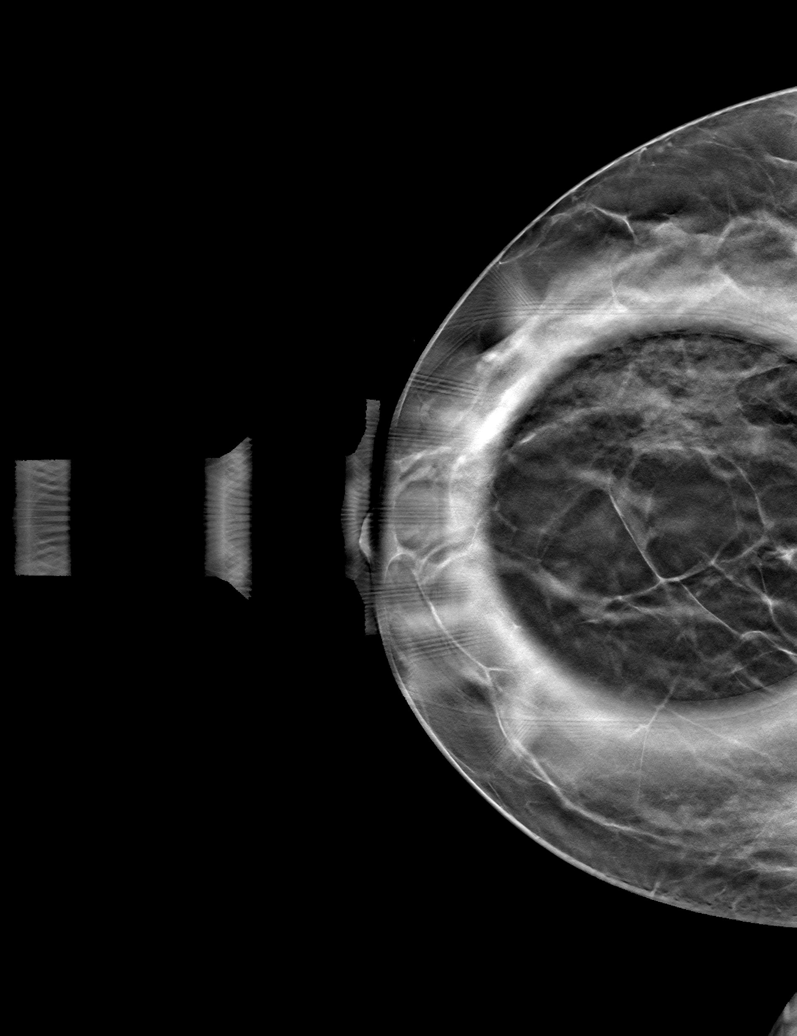

[6 of 18 positions shown; findings below may reference images not displayed]

ACR Breast Density Category b: There are scattered areas of
fibroglandular density.
FINDINGS: There is a persistent subtle mass on the spot compression
tomosynthesis images of the inferior right breast.

Mammographic images were processed with CAD.

Ultrasound of the right breast at [DATE], 3 cm from the nipple
demonstrates a septated anechoic oval circumscribed mass measuring
1.4 x 0.6 x 1.1 cm.
IMPRESSION: The mass in the inferior right breast corresponds with a benign
cyst.

RECOMMENDATION:
Screening mammogram in one year.(Code:XR-X-SII)

I have discussed the findings and recommendations with the patient.
Results were also provided in writing at the conclusion of the
visit. If applicable, a reminder letter will be sent to the patient
regarding the next appointment.

BI-RADS CATEGORY  2: Benign.

## 2020-04-21 ENCOUNTER — Other Ambulatory Visit: Payer: Self-pay | Admitting: Obstetrics and Gynecology

## 2020-04-21 DIAGNOSIS — R928 Other abnormal and inconclusive findings on diagnostic imaging of breast: Secondary | ICD-10-CM

## 2020-04-23 ENCOUNTER — Ambulatory Visit
Admission: RE | Admit: 2020-04-23 | Discharge: 2020-04-23 | Disposition: A | Discharge status: Home / Self Care | Payer: 59 | Source: Ambulatory Visit | Attending: Obstetrics and Gynecology | Admitting: Obstetrics and Gynecology

## 2020-04-23 ENCOUNTER — Other Ambulatory Visit: Payer: Self-pay

## 2020-04-23 DIAGNOSIS — R928 Other abnormal and inconclusive findings on diagnostic imaging of breast: Secondary | ICD-10-CM

## 2020-04-29 ENCOUNTER — Other Ambulatory Visit: Payer: Self-pay | Admitting: Obstetrics and Gynecology

## 2020-04-29 DIAGNOSIS — Z30431 Encounter for routine checking of intrauterine contraceptive device: Secondary | ICD-10-CM

## 2020-06-01 ENCOUNTER — Other Ambulatory Visit: Payer: 59

## 2020-06-14 ENCOUNTER — Ambulatory Visit
Admission: RE | Admit: 2020-06-14 | Discharge: 2020-06-14 | Disposition: A | Discharge status: Home / Self Care | Payer: 59 | Source: Ambulatory Visit | Attending: Obstetrics and Gynecology | Admitting: Obstetrics and Gynecology

## 2020-06-14 DIAGNOSIS — Z30431 Encounter for routine checking of intrauterine contraceptive device: Secondary | ICD-10-CM

## 2020-10-24 ENCOUNTER — Other Ambulatory Visit: Payer: Self-pay | Admitting: Obstetrics and Gynecology

## 2020-10-24 DIAGNOSIS — R928 Other abnormal and inconclusive findings on diagnostic imaging of breast: Secondary | ICD-10-CM

## 2020-11-09 ENCOUNTER — Other Ambulatory Visit: Payer: 59

## 2021-05-11 ENCOUNTER — Other Ambulatory Visit: Payer: 59

## 2021-05-11 ENCOUNTER — Other Ambulatory Visit: Payer: Self-pay

## 2021-06-16 ENCOUNTER — Other Ambulatory Visit: Payer: Self-pay | Admitting: Obstetrics and Gynecology

## 2021-06-16 ENCOUNTER — Ambulatory Visit
Admission: RE | Admit: 2021-06-16 | Discharge: 2021-06-16 | Disposition: A | Discharge status: Home / Self Care | Payer: 59 | Source: Ambulatory Visit | Attending: Obstetrics and Gynecology | Admitting: Obstetrics and Gynecology

## 2021-06-16 ENCOUNTER — Ambulatory Visit
Admission: RE | Admit: 2021-06-16 | Discharge: 2021-06-16 | Disposition: A | Discharge status: Home / Self Care | Payer: Self-pay | Source: Ambulatory Visit | Attending: Obstetrics and Gynecology | Admitting: Obstetrics and Gynecology

## 2021-06-16 DIAGNOSIS — R928 Other abnormal and inconclusive findings on diagnostic imaging of breast: Secondary | ICD-10-CM

## 2022-05-07 ENCOUNTER — Other Ambulatory Visit: Payer: Self-pay | Admitting: Obstetrics and Gynecology

## 2022-05-07 DIAGNOSIS — R928 Other abnormal and inconclusive findings on diagnostic imaging of breast: Secondary | ICD-10-CM

## 2022-07-04 ENCOUNTER — Other Ambulatory Visit: Payer: 59

## 2022-07-11 ENCOUNTER — Ambulatory Visit
Admission: RE | Admit: 2022-07-11 | Discharge: 2022-07-11 | Disposition: A | Discharge status: Home / Self Care | Payer: 59 | Source: Ambulatory Visit | Attending: Obstetrics and Gynecology | Admitting: Obstetrics and Gynecology

## 2022-07-11 ENCOUNTER — Ambulatory Visit
Admission: RE | Admit: 2022-07-11 | Discharge: 2022-07-11 | Disposition: A | Discharge status: Home / Self Care | Payer: Commercial Managed Care - HMO | Source: Ambulatory Visit | Attending: Obstetrics and Gynecology | Admitting: Obstetrics and Gynecology

## 2022-07-11 DIAGNOSIS — R928 Other abnormal and inconclusive findings on diagnostic imaging of breast: Secondary | ICD-10-CM

## 2022-10-22 ENCOUNTER — Other Ambulatory Visit: Payer: Self-pay | Admitting: Obstetrics and Gynecology

## 2022-10-22 DIAGNOSIS — N6310 Unspecified lump in the right breast, unspecified quadrant: Secondary | ICD-10-CM

## 2022-11-09 ENCOUNTER — Ambulatory Visit
Admission: RE | Admit: 2022-11-09 | Discharge: 2022-11-09 | Disposition: A | Discharge status: Home / Self Care | Payer: Commercial Managed Care - HMO | Source: Ambulatory Visit | Attending: Obstetrics and Gynecology | Admitting: Obstetrics and Gynecology

## 2022-11-09 DIAGNOSIS — N6310 Unspecified lump in the right breast, unspecified quadrant: Secondary | ICD-10-CM
